# Patient Record
Sex: Male | Born: 2000 | Race: Black or African American | Hispanic: No | Marital: Single | Smoking: Never smoker
Health system: Southern US, Community
[De-identification: ages and names within clinical notes are randomized; demographics above are authoritative.]

---
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
## 2022-02-03 IMAGING — CR DG KNEE COMPLETE 4+V*L*
4 series · 4 of 4 positions shown · non-contrast
Comparison: None.

CLINICAL DATA: Status post fall.

EXAM:
LEFT KNEE - COMPLETE 4+ VIEW

[t knee obl left (1 of 2)]
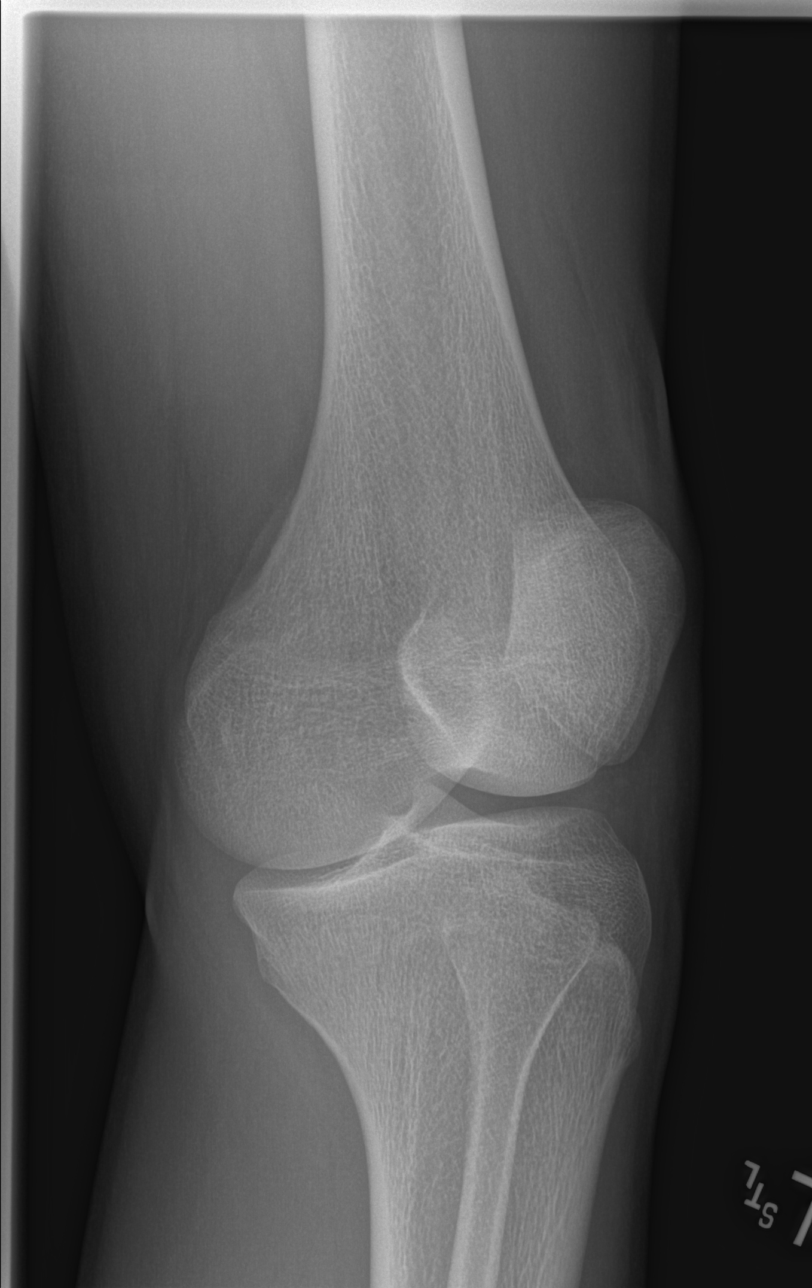

[t knee ap left]
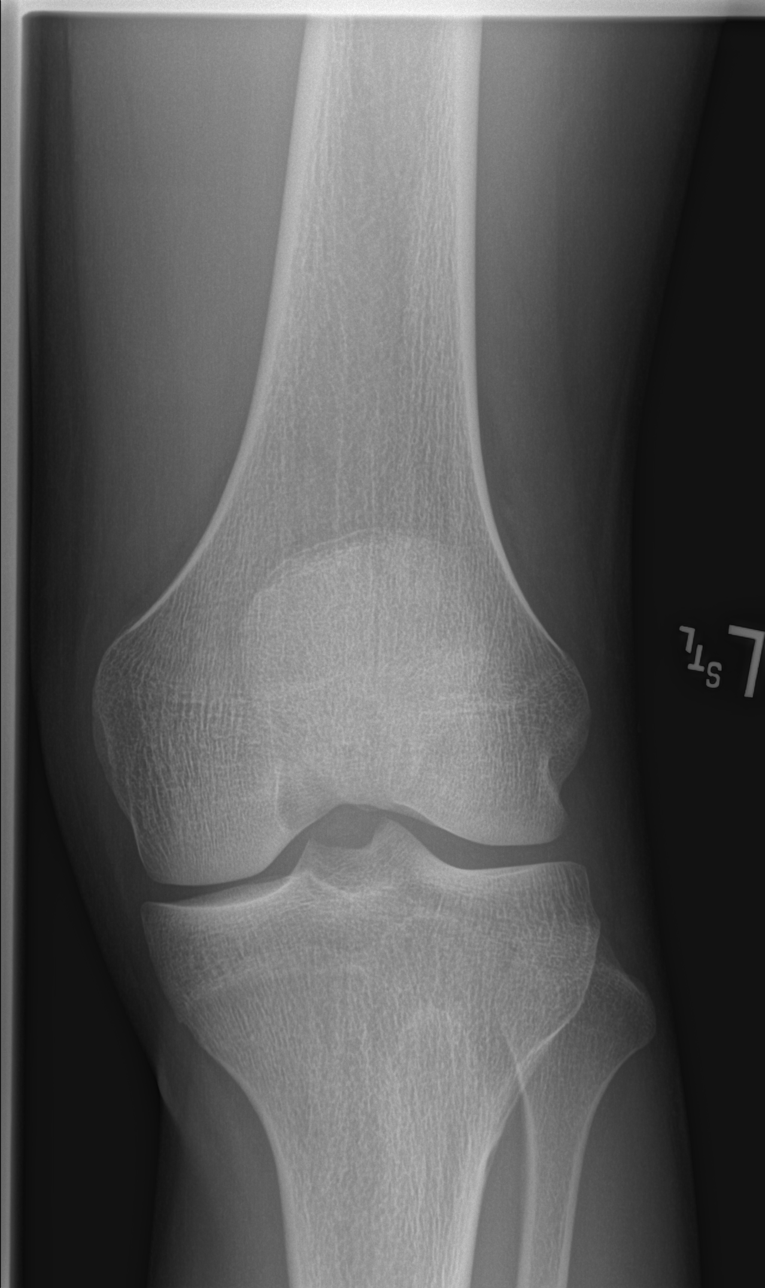

[t knee obl left (2 of 2)]
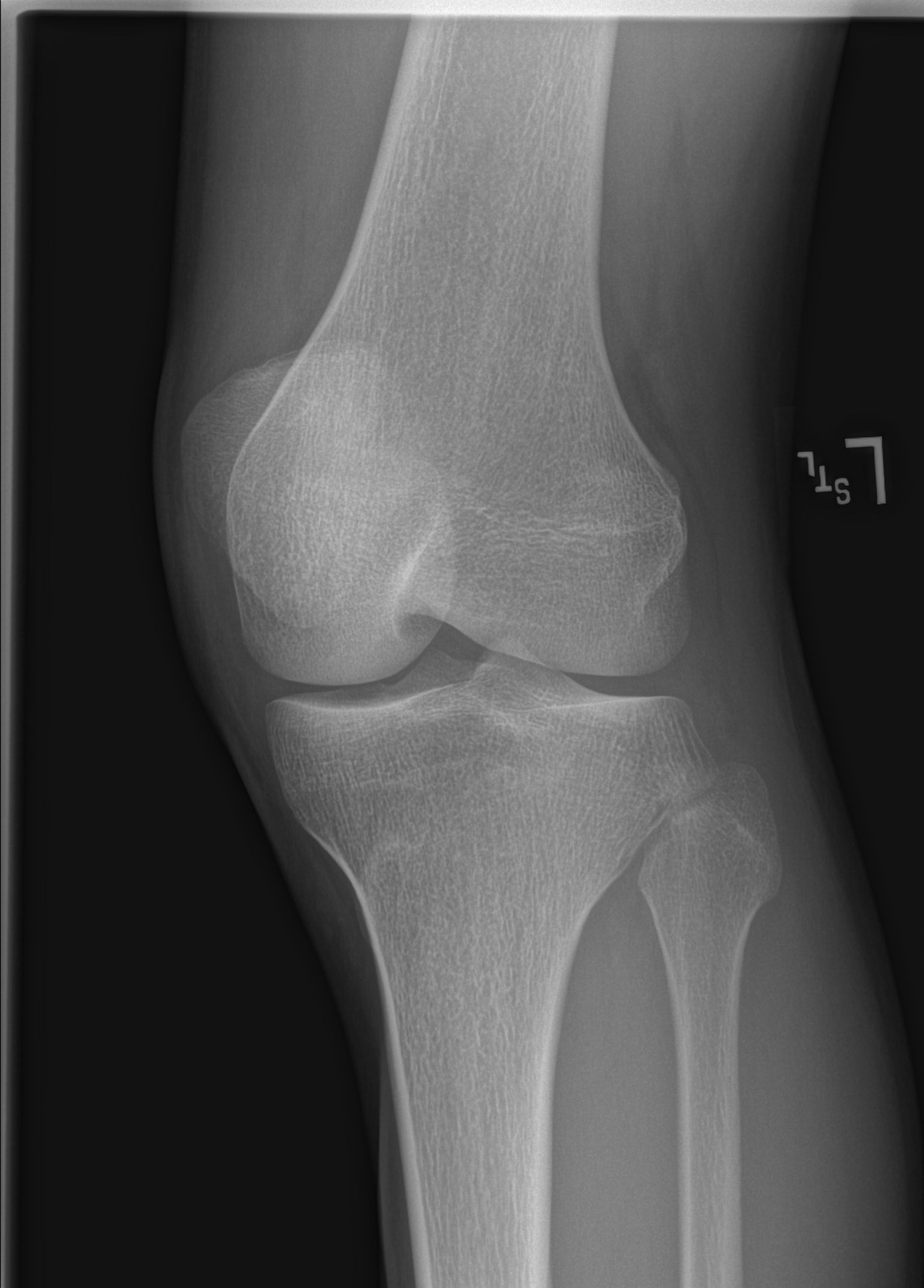

[t knee lat left]
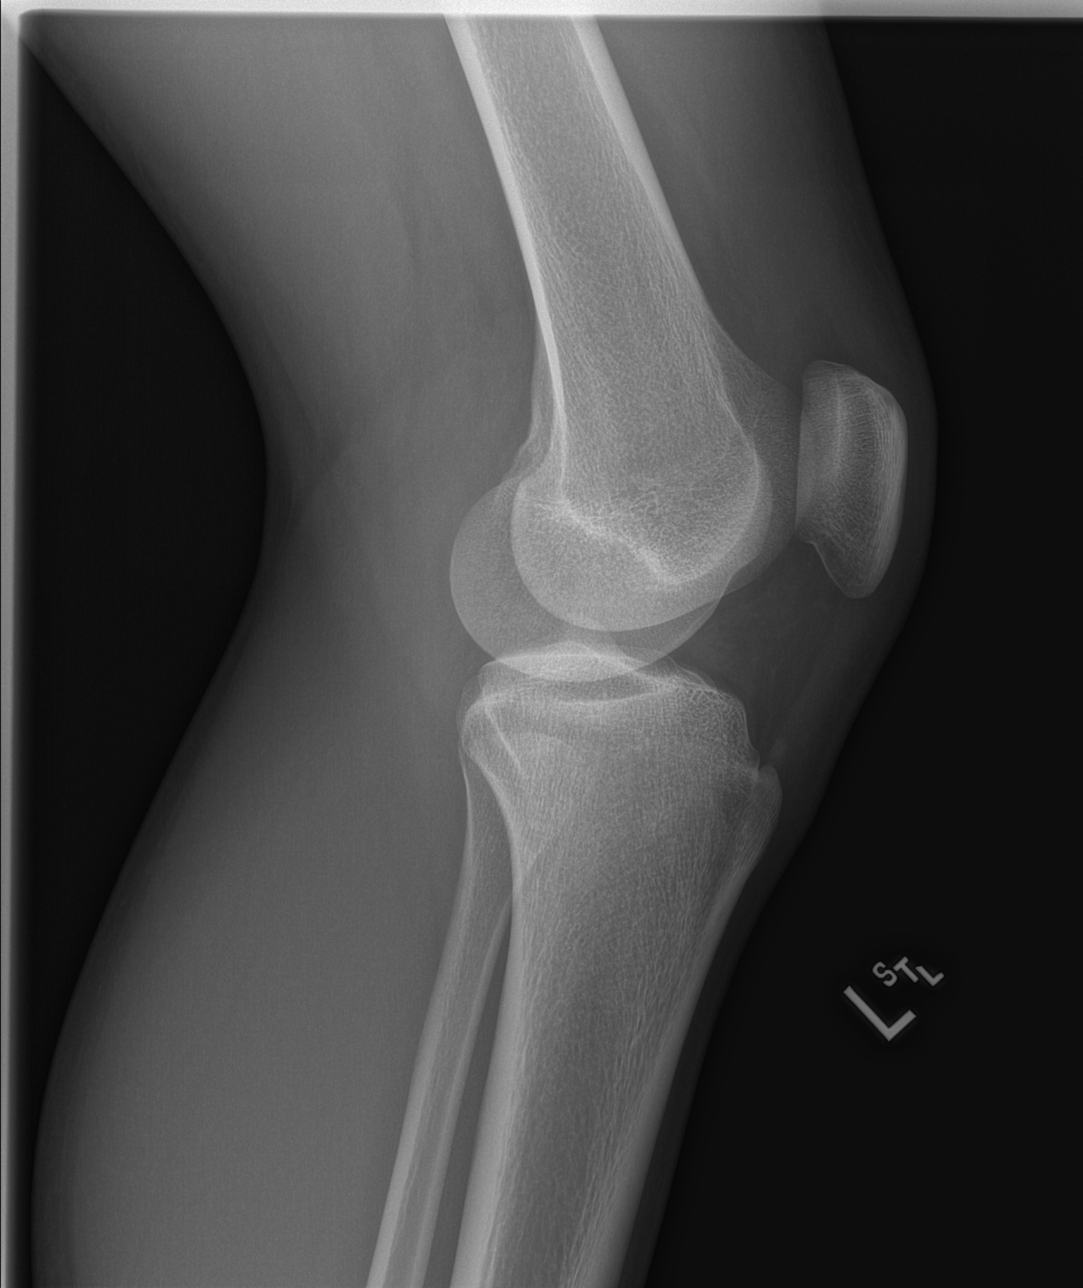

[4 of 4 positions shown; findings below may reference images not displayed]

FINDINGS: No evidence of fracture, dislocation, or joint effusion. No evidence
of arthropathy or other focal bone abnormality. Soft tissues are
unremarkable.
IMPRESSION: Negative.
